# Patient Record
Sex: Female | Born: 1955 | Race: White | Hispanic: No | Marital: Married | Smoking: Never smoker
Health system: Southern US, Community
[De-identification: ages and names within clinical notes are randomized; demographics above are authoritative.]

## PROBLEM LIST (undated history)

## (undated) DIAGNOSIS — I341 Nonrheumatic mitral (valve) prolapse: Secondary | ICD-10-CM

## (undated) HISTORY — DX: Nonrheumatic mitral (valve) prolapse: I34.1

## (undated) HISTORY — PX: FOOT SURGERY: SHX648

## (undated) HISTORY — PX: TUBAL LIGATION: SHX77

---

## 1998-07-12 ENCOUNTER — Other Ambulatory Visit: Admission: RE | Admit: 1998-07-12 | Discharge: 1998-07-12 | Payer: Self-pay | Admitting: Gynecology

## 1999-08-01 ENCOUNTER — Other Ambulatory Visit: Admission: RE | Admit: 1999-08-01 | Discharge: 1999-08-01 | Payer: Self-pay | Admitting: Gynecology

## 1999-08-30 ENCOUNTER — Encounter (INDEPENDENT_AMBULATORY_CARE_PROVIDER_SITE_OTHER): Payer: Self-pay | Admitting: *Deleted

## 1999-08-30 ENCOUNTER — Ambulatory Visit (HOSPITAL_COMMUNITY): Admission: RE | Admit: 1999-08-30 | Discharge: 1999-08-30 | Payer: Self-pay | Admitting: Gynecology

## 1999-08-30 HISTORY — PX: PELVIC LAPAROSCOPY: SHX162

## 2000-09-09 ENCOUNTER — Other Ambulatory Visit: Admission: RE | Admit: 2000-09-09 | Discharge: 2000-09-09 | Payer: Self-pay | Admitting: Gynecology

## 2000-09-16 ENCOUNTER — Encounter: Payer: Self-pay | Admitting: Gynecology

## 2000-09-16 ENCOUNTER — Encounter: Admission: RE | Admit: 2000-09-16 | Discharge: 2000-09-16 | Payer: Self-pay | Admitting: Gynecology

## 2002-04-14 ENCOUNTER — Encounter: Payer: Self-pay | Admitting: Gynecology

## 2002-04-14 ENCOUNTER — Encounter: Admission: RE | Admit: 2002-04-14 | Discharge: 2002-04-14 | Payer: Self-pay | Admitting: Gynecology

## 2002-04-27 ENCOUNTER — Other Ambulatory Visit: Admission: RE | Admit: 2002-04-27 | Discharge: 2002-04-27 | Payer: Self-pay | Admitting: Gynecology

## 2003-04-22 ENCOUNTER — Encounter: Payer: Self-pay | Admitting: Gynecology

## 2003-04-22 ENCOUNTER — Encounter: Admission: RE | Admit: 2003-04-22 | Discharge: 2003-04-22 | Payer: Self-pay | Admitting: Gynecology

## 2003-08-02 ENCOUNTER — Other Ambulatory Visit: Admission: RE | Admit: 2003-08-02 | Discharge: 2003-08-02 | Payer: Self-pay | Admitting: Gynecology

## 2004-10-13 ENCOUNTER — Encounter: Admission: RE | Admit: 2004-10-13 | Discharge: 2004-10-13 | Payer: Self-pay | Admitting: Gynecology

## 2004-12-06 ENCOUNTER — Other Ambulatory Visit: Admission: RE | Admit: 2004-12-06 | Discharge: 2004-12-06 | Payer: Self-pay | Admitting: Gynecology

## 2005-04-04 ENCOUNTER — Ambulatory Visit: Payer: Self-pay | Admitting: Family Medicine

## 2005-12-25 ENCOUNTER — Encounter: Admission: RE | Admit: 2005-12-25 | Discharge: 2005-12-25 | Payer: Self-pay | Admitting: Gynecology

## 2006-05-03 ENCOUNTER — Other Ambulatory Visit: Admission: RE | Admit: 2006-05-03 | Discharge: 2006-05-03 | Payer: Self-pay | Admitting: Gynecology

## 2007-05-13 ENCOUNTER — Other Ambulatory Visit: Admission: RE | Admit: 2007-05-13 | Discharge: 2007-05-13 | Payer: Self-pay | Admitting: Gynecology

## 2008-06-17 ENCOUNTER — Encounter: Admission: RE | Admit: 2008-06-17 | Discharge: 2008-06-17 | Payer: Self-pay | Admitting: Gynecology

## 2008-06-22 ENCOUNTER — Other Ambulatory Visit: Admission: RE | Admit: 2008-06-22 | Discharge: 2008-06-22 | Payer: Self-pay | Admitting: Gynecology

## 2008-06-25 ENCOUNTER — Encounter: Admission: RE | Admit: 2008-06-25 | Discharge: 2008-06-25 | Payer: Self-pay | Admitting: Gynecology

## 2009-10-11 ENCOUNTER — Ambulatory Visit: Payer: Self-pay | Admitting: Gynecology

## 2009-10-11 ENCOUNTER — Other Ambulatory Visit: Admission: RE | Admit: 2009-10-11 | Discharge: 2009-10-11 | Payer: Self-pay | Admitting: Gynecology

## 2009-10-25 ENCOUNTER — Ambulatory Visit: Payer: Self-pay | Admitting: Gynecology

## 2009-10-26 ENCOUNTER — Ambulatory Visit: Payer: Self-pay | Admitting: Podiatry

## 2010-02-20 ENCOUNTER — Ambulatory Visit: Payer: Self-pay | Admitting: Gynecology

## 2010-05-25 ENCOUNTER — Encounter: Admission: RE | Admit: 2010-05-25 | Discharge: 2010-05-25 | Payer: Self-pay | Admitting: Gynecology

## 2010-10-05 ENCOUNTER — Ambulatory Visit: Payer: Self-pay | Admitting: Gynecology

## 2010-10-05 ENCOUNTER — Other Ambulatory Visit: Admission: RE | Admit: 2010-10-05 | Discharge: 2010-10-05 | Payer: Self-pay | Admitting: Gynecology

## 2010-11-08 ENCOUNTER — Ambulatory Visit: Payer: Self-pay | Admitting: Gynecology

## 2011-06-29 ENCOUNTER — Other Ambulatory Visit: Payer: Self-pay | Admitting: Gynecology

## 2011-06-29 DIAGNOSIS — Z1231 Encounter for screening mammogram for malignant neoplasm of breast: Secondary | ICD-10-CM

## 2011-07-12 ENCOUNTER — Ambulatory Visit
Admission: RE | Admit: 2011-07-12 | Discharge: 2011-07-12 | Disposition: A | Discharge status: Home / Self Care | Payer: Self-pay | Source: Ambulatory Visit | Attending: Gynecology | Admitting: Gynecology

## 2011-07-12 DIAGNOSIS — Z1231 Encounter for screening mammogram for malignant neoplasm of breast: Secondary | ICD-10-CM

## 2011-10-03 DIAGNOSIS — I341 Nonrheumatic mitral (valve) prolapse: Secondary | ICD-10-CM | POA: Insufficient documentation

## 2011-10-08 ENCOUNTER — Encounter: Payer: Self-pay | Admitting: Gynecology

## 2011-10-08 ENCOUNTER — Ambulatory Visit (INDEPENDENT_AMBULATORY_CARE_PROVIDER_SITE_OTHER): Payer: 59 | Admitting: Gynecology

## 2011-10-08 VITALS — BP 112/64 | Ht 67.0 in | Wt 126.0 lb

## 2011-10-08 DIAGNOSIS — Z131 Encounter for screening for diabetes mellitus: Secondary | ICD-10-CM

## 2011-10-08 DIAGNOSIS — N949 Unspecified condition associated with female genital organs and menstrual cycle: Secondary | ICD-10-CM

## 2011-10-08 DIAGNOSIS — Z780 Asymptomatic menopausal state: Secondary | ICD-10-CM

## 2011-10-08 DIAGNOSIS — N938 Other specified abnormal uterine and vaginal bleeding: Secondary | ICD-10-CM

## 2011-10-08 DIAGNOSIS — Z13220 Encounter for screening for lipoid disorders: Secondary | ICD-10-CM

## 2011-10-08 DIAGNOSIS — Z01419 Encounter for gynecological examination (general) (routine) without abnormal findings: Secondary | ICD-10-CM

## 2011-10-08 DIAGNOSIS — Z79890 Hormone replacement therapy (postmenopausal): Secondary | ICD-10-CM

## 2011-10-08 MED ORDER — ESTRADIOL 0.1 MG/24HR TD PTTW: 1.0000 | MEDICATED_PATCH | TRANSDERMAL | Status: DC

## 2011-10-08 MED ORDER — PROGESTERONE MICRONIZED 100 MG PO CAPS: 100.0000 mg | ORAL_CAPSULE | Freq: Every day | ORAL | Status: DC

## 2011-10-08 NOTE — Progress Notes (Signed)
Cynthia Edwards [birth year] [id number]        55 y.o.  for annual exam.  Continues on HRT doing well.  Past medical history,surgical history, medications, allergies, family history and social history were all reviewed and documented in the EPIC chart. ROS:  Was performed and pertinent positives and negatives are included in the history.  Exam: chaperone present Filed Vitals:   10/08/11 1431  BP: 112/64   General appearance  Normal Skin grossly normal Head/Neck normal with no cervical or supraclavicular adenopathy thyroid normal Lungs  clear Cardiac RR, without RMG Abdominal  soft, nontender, without masses, organomegaly or hernia Breasts  examined lying and sitting without masses, retractions, discharge or axillary adenopathy. Pelvic  Ext/BUS/vagina  normal with mild atrophic changes  Cervix  normal    Uterus  anteverted, normal size, shape and contour, midline and mobile nontender   Adnexa  Without masses or tenderness    Anus and perineum  normal   Rectovaginal  normal sphincter tone without palpated masses or tenderness.    Assessment/Plan:  55 y.o. female for annual exam.    1. HRT. Patient has had some sporadic spotting this past year. Given she is on HRT I recommended we follow up for endometrial assessment and we'll start with sonohysterogram. Patient will schedule this. We again discussed the issues of HRT risk benefits WHI study increased risk of stroke heart attack DVT as well as increased breast cancer risk. ACOG and NAMS statements for the lowest dose for shortness period time were reviewed. Transdermal versus oral first pass effect benefits also discussed. I refilled both her Vivelle 0.1 mg and Prometrium 100 mg nightly times a year. She will decided if she once to continue or wean herself off this coming year. 2. Breast health. SBE monthly reviewed. She had mammography in August 2012 continue with annual mammography. 3. Bone health. Patient's last bone density was 5 years  ago historically we'll go ahead and repeat a DEXA now she will go ahead and schedule this. Increase calcium vitamin D reviewed. Baseline vitamin D level was ordered today. 4. Pap smear. I reviewed current Pap smear guidelines. I did not do a Pap smear today as she does not have a history of abnormal Pap smears in the past and has numerous normal reports in her chart. Her last Pap smear was a year ago. We'll plan on an every 3 year interval. 5. Colonoscopy. Patient has not had colonoscopy I strongly recommended her to schedule this and she agrees to do so. 6. Health maintenance. CBC urinalysis glucose lipid profile was ordered along with her vitamin D. She will follow up for her sonohysterogram index as noted above and otherwise on an annual basis.    [name] MD, 3:09 PM 10/08/2011

## 2011-10-26 ENCOUNTER — Ambulatory Visit (INDEPENDENT_AMBULATORY_CARE_PROVIDER_SITE_OTHER): Payer: 59

## 2011-10-26 ENCOUNTER — Encounter: Payer: Self-pay | Admitting: Gynecology

## 2011-10-26 ENCOUNTER — Ambulatory Visit (INDEPENDENT_AMBULATORY_CARE_PROVIDER_SITE_OTHER): Payer: 59 | Admitting: Gynecology

## 2011-10-26 DIAGNOSIS — N938 Other specified abnormal uterine and vaginal bleeding: Secondary | ICD-10-CM

## 2011-10-26 DIAGNOSIS — Z79890 Hormone replacement therapy (postmenopausal): Secondary | ICD-10-CM

## 2011-10-26 DIAGNOSIS — D259 Leiomyoma of uterus, unspecified: Secondary | ICD-10-CM

## 2011-10-26 DIAGNOSIS — N950 Postmenopausal bleeding: Secondary | ICD-10-CM

## 2011-10-26 DIAGNOSIS — N949 Unspecified condition associated with female genital organs and menstrual cycle: Secondary | ICD-10-CM

## 2011-10-26 NOTE — Progress Notes (Signed)
Patient presents for sonohysterogram due to postmenopausal spotting on HRT. Ultrasound initially shows endometrial echo of 3.7 mm. She has multiple small myomas largest measuring 19 mm. Right / left ovaries are visualized and normal. Subsequently sonohysterogram was performed, sterile technique, easy catheter introduction, good distention with no abnormalities. An endometrial sample was taken. Patient tolerated well although she had a fair amount of discomfort with the procedure.  Assessment and plan: Postmenopausal spotting, atrophic pattern on ultrasound with scant return of biopsy. I discussed with her that if biopsy returns inadequate that's acceptable and that we'll continue to monitor. If persistent bleeding then will make adjustments to her HRT. Otherwise she will see me in a year sooner as needed.

## 2011-10-26 NOTE — Patient Instructions (Signed)
Report any continued bleeding otherwise follow up annually

## 2011-12-26 ENCOUNTER — Ambulatory Visit (INDEPENDENT_AMBULATORY_CARE_PROVIDER_SITE_OTHER): Payer: BC Managed Care – PPO

## 2011-12-26 DIAGNOSIS — Z780 Asymptomatic menopausal state: Secondary | ICD-10-CM

## 2011-12-26 DIAGNOSIS — Z13820 Encounter for screening for osteoporosis: Secondary | ICD-10-CM

## 2012-10-01 ENCOUNTER — Telehealth: Payer: Self-pay | Admitting: *Deleted

## 2012-10-01 MED ORDER — ESTRADIOL 0.1 MG/24HR TD PTTW: 1.0000 | MEDICATED_PATCH | TRANSDERMAL | Status: DC

## 2012-10-01 NOTE — Telephone Encounter (Signed)
Pt has annual scheduled on 10/10/12. Pt will need rx for patches, they expired this week. rx will be sent 0 refills.

## 2012-10-10 ENCOUNTER — Ambulatory Visit (INDEPENDENT_AMBULATORY_CARE_PROVIDER_SITE_OTHER): Payer: BC Managed Care – PPO | Admitting: Gynecology

## 2012-10-10 ENCOUNTER — Encounter: Payer: Self-pay | Admitting: Gynecology

## 2012-10-10 VITALS — BP 104/64 | Ht 66.25 in | Wt 134.0 lb

## 2012-10-10 DIAGNOSIS — Z01419 Encounter for gynecological examination (general) (routine) without abnormal findings: Secondary | ICD-10-CM

## 2012-10-10 DIAGNOSIS — Z79890 Hormone replacement therapy (postmenopausal): Secondary | ICD-10-CM

## 2012-10-10 DIAGNOSIS — Z131 Encounter for screening for diabetes mellitus: Secondary | ICD-10-CM

## 2012-10-10 DIAGNOSIS — Z13220 Encounter for screening for lipoid disorders: Secondary | ICD-10-CM

## 2012-10-10 MED ORDER — ESTRADIOL 0.05 MG/24HR TD PTTW: 1.0000 | MEDICATED_PATCH | TRANSDERMAL | Status: DC

## 2012-10-10 MED ORDER — PROGESTERONE MICRONIZED 100 MG PO CAPS: 100.0000 mg | ORAL_CAPSULE | Freq: Every day | ORAL | Status: DC

## 2012-10-10 NOTE — Patient Instructions (Signed)
Call to Schedule your mammogram  Facilities in [place]: 1)  [facility], [address] Phone: [phone] 2)  [facility], [address] Phone: [phone] 3)  Dr. Bertrand at [facility]  [address] Phone: [phone]     Mammogram A mammogram is an X-ray test to find changes in a woman's breast. You should get a mammogram if:  You are 40 years of age or older  You have risk factors.   Your doctor recommends that you have one.  BEFORE THE TEST  Do not schedule the test the week before your period, especially if your breasts are sore during this time.  On the day of your mammogram:  Wash your breasts and armpits well. After washing, do not put on any deodorant or talcum powder on until after your test.   Eat and drink as you usually do.   Take your medicines as usual.   If you are diabetic and take insulin, make sure you:   Eat before coming for your test.   Take your insulin as usual.   If you cannot keep your appointment, call before the appointment to cancel. Schedule another appointment.  TEST  You will need to undress from the waist up. You will put on a hospital gown.   Your breast will be put on the mammogram machine, and it will press firmly on your breast with a piece of plastic called a compression paddle. This will make your breast flatter so that the machine can X-ray all parts of your breast.   Both breasts will be X-rayed. Each breast will be X-rayed from above and from the side. An X-ray might need to be taken again if the picture is not good enough.   The mammogram will last about 15 to 30 minutes.  AFTER THE TEST Finding out the results of your test Ask when your test results will be ready. Make sure you get your test results.  Document Released: 02/01/2009 Document Revised: 10/25/2011 Document Reviewed: 02/01/2009 [employer] Patient  Information 2012 [employer]   

## 2012-10-10 NOTE — Progress Notes (Signed)
MOSELLA FALTER [birth year] [id number]        56 y.o.  G0P0 for annual exam.    Past medical history,surgical history, medications, allergies, family history and social history were all reviewed and documented in the EPIC chart. ROS:  Was performed and pertinent positives and negatives are included in the history.  Exam: Sherrilyn Rist assistant Filed Vitals:   10/10/12 1406  BP: 104/64  Height: 5' 6.25" (1.683 m)  Weight: 134 lb (60.782 kg)   General appearance  Normal Skin grossly normal Head/Neck normal with no cervical or supraclavicular adenopathy thyroid normal Lungs  clear Cardiac RR, without RMG Abdominal  soft, nontender, without masses, organomegaly or hernia Breasts  examined lying and sitting without masses, retractions, discharge or axillary adenopathy. Pelvic  Ext/BUS/vagina  normal was no atrophic changes  Cervix  normal   Uterus  anteverted, normal size, shape and contour, midline and mobile nontender   Adnexa  Without masses or tenderness    Anus and perineum  normal   Rectovaginal  normal sphincter tone without palpated masses or tenderness.    Assessment/Plan:  56 y.o. G0P0 female for annual exam.   1. HRT. Patient is on Vivelle 0.1 mg and Prometrium 100 mg at night. She continues to have sporadic menses. Had one in May and then one in November. Patient reports they're just like regular menses in flow and character.  Was evaluated with normal sonohysterogram and biopsy showing secretory endometrium 10/2011.  I again reviewed the risks benefits of HRT to include the WHI study and risks of stroke heart attack DVT and breast cancer. Benefits of transdermal first pass effect also discussed. Options to wean or stop altogether versus continuing discussed. Patient was to continue I did suggest trying a lower dose and I gave her samples of 0.05 MiniVivelle and refill this times a year and Prometrium 100 mg nightly times a year. Will keep menstrual calendar at this point as long as  less frequent but regular will watch if prolonged or atypical should represent for evaluation. 2. Mammography. Patient overdo it she knows to schedule this. SBE monthly reviewed. 3. Pap smear. No Pap smear done today. Last Pap smear 2011. No history of abnormal Pap smears. Plan repeat next year a 3 year interval 4. Colonoscopy. Patients never happen I stressed the need to schedule him the importance of early detection. Patient agrees to do so. 5. DEXA. DEXA 2013 normal. Plan repeat at five-year interval.  Increased calcium vitamin D reviewed.  Check vitamin D level today. 6. Health maintenance.  Baseline CBC lipid profile offer his metabolic panel urinalysis ordered along with vitamin D. Follow up one year, sooner as needed    [name] MD, 2:30 PM 10/10/2012

## 2012-10-11 LAB — CBC WITH DIFFERENTIAL/PLATELET
Basophils Absolute: 0 10*3/uL (ref 0.0–0.1)
Eosinophils Absolute: 0.1 10*3/uL (ref 0.0–0.7)
Eosinophils Relative: 1 % (ref 0–5)
Lymphocytes Relative: 25 % (ref 12–46)
MCH: 29.2 pg (ref 26.0–34.0)
MCV: 87.4 fL (ref 78.0–100.0)
Platelets: 229 10*3/uL (ref 150–400)
RDW: 13.5 % (ref 11.5–15.5)
WBC: 5.7 10*3/uL (ref 4.0–10.5)

## 2012-10-11 LAB — COMPREHENSIVE METABOLIC PANEL
ALT: 15 U/L (ref 0–35)
AST: 22 U/L (ref 0–37)
Alkaline Phosphatase: 50 U/L (ref 39–117)
Creat: 0.77 mg/dL (ref 0.50–1.10)
Total Bilirubin: 0.4 mg/dL (ref 0.3–1.2)

## 2012-10-11 LAB — URINALYSIS W MICROSCOPIC + REFLEX CULTURE
Leukocytes, UA: NEGATIVE
Protein, ur: NEGATIVE mg/dL
Squamous Epithelial / LPF: NONE SEEN
Urobilinogen, UA: 0.2 mg/dL (ref 0.0–1.0)

## 2012-10-11 LAB — LIPID PANEL
Total CHOL/HDL Ratio: 3 Ratio
VLDL: 16 mg/dL (ref 0–40)

## 2012-10-11 LAB — VITAMIN D 25 HYDROXY (VIT D DEFICIENCY, FRACTURES): Vit D, 25-Hydroxy: 31 ng/mL (ref 30–89)

## 2013-04-02 ENCOUNTER — Other Ambulatory Visit: Payer: Self-pay

## 2013-04-02 DIAGNOSIS — Z1231 Encounter for screening mammogram for malignant neoplasm of breast: Secondary | ICD-10-CM

## 2013-04-22 ENCOUNTER — Ambulatory Visit
Admission: RE | Admit: 2013-04-22 | Discharge: 2013-04-22 | Disposition: A | Discharge status: Home / Self Care | Payer: BC Managed Care – PPO | Source: Ambulatory Visit

## 2013-04-22 DIAGNOSIS — Z1231 Encounter for screening mammogram for malignant neoplasm of breast: Secondary | ICD-10-CM

## 2013-04-23 ENCOUNTER — Other Ambulatory Visit: Payer: Self-pay | Admitting: Gynecology

## 2013-04-24 ENCOUNTER — Other Ambulatory Visit: Payer: Self-pay | Admitting: Gynecology

## 2013-04-27 ENCOUNTER — Other Ambulatory Visit: Payer: Self-pay | Admitting: Gynecology

## 2013-04-27 DIAGNOSIS — R928 Other abnormal and inconclusive findings on diagnostic imaging of breast: Secondary | ICD-10-CM

## 2013-05-08 ENCOUNTER — Ambulatory Visit
Admission: RE | Admit: 2013-05-08 | Discharge: 2013-05-08 | Disposition: A | Discharge status: Home / Self Care | Payer: BC Managed Care – PPO | Source: Ambulatory Visit | Attending: Gynecology | Admitting: Gynecology

## 2013-05-08 DIAGNOSIS — R928 Other abnormal and inconclusive findings on diagnostic imaging of breast: Secondary | ICD-10-CM

## 2013-10-19 ENCOUNTER — Ambulatory Visit (INDEPENDENT_AMBULATORY_CARE_PROVIDER_SITE_OTHER): Payer: Managed Care, Other (non HMO) | Admitting: Gynecology

## 2013-10-19 ENCOUNTER — Other Ambulatory Visit (HOSPITAL_COMMUNITY)
Admission: RE | Admit: 2013-10-19 | Discharge: 2013-10-19 | Disposition: A | Discharge status: Home / Self Care | Payer: Managed Care, Other (non HMO) | Source: Ambulatory Visit | Attending: Gynecology | Admitting: Gynecology

## 2013-10-19 ENCOUNTER — Encounter: Payer: Self-pay | Admitting: Gynecology

## 2013-10-19 VITALS — BP 116/70 | Ht 66.0 in | Wt 126.0 lb

## 2013-10-19 DIAGNOSIS — Z79890 Hormone replacement therapy (postmenopausal): Secondary | ICD-10-CM

## 2013-10-19 DIAGNOSIS — Z01419 Encounter for gynecological examination (general) (routine) without abnormal findings: Secondary | ICD-10-CM | POA: Insufficient documentation

## 2013-10-19 LAB — COMPREHENSIVE METABOLIC PANEL
ALT: 11 U/L (ref 0–35)
AST: 16 U/L (ref 0–37)
Albumin: 4.4 g/dL (ref 3.5–5.2)
CO2: 31 mEq/L (ref 19–32)
Calcium: 9.9 mg/dL (ref 8.4–10.5)
Chloride: 101 mEq/L (ref 96–112)
Creat: 0.76 mg/dL (ref 0.50–1.10)
Potassium: 3.8 mEq/L (ref 3.5–5.3)
Sodium: 140 mEq/L (ref 135–145)
Total Protein: 6.6 g/dL (ref 6.0–8.3)

## 2013-10-19 LAB — CBC WITH DIFFERENTIAL/PLATELET
Eosinophils Relative: 1 % (ref 0–5)
Lymphocytes Relative: 23 % (ref 12–46)
Lymphs Abs: 1.3 10*3/uL (ref 0.7–4.0)
MCV: 88.6 fL (ref 78.0–100.0)
Neutro Abs: 3.9 10*3/uL (ref 1.7–7.7)
Platelets: 235 10*3/uL (ref 150–400)
RBC: 4.65 MIL/uL (ref 3.87–5.11)
WBC: 5.5 10*3/uL (ref 4.0–10.5)

## 2013-10-19 LAB — LIPID PANEL: Cholesterol: 239 mg/dL — ABNORMAL HIGH (ref 0–200)

## 2013-10-19 MED ORDER — PROGESTERONE MICRONIZED 100 MG PO CAPS: 100.0000 mg | ORAL_CAPSULE | Freq: Every day | ORAL | Status: DC

## 2013-10-19 MED ORDER — ESTRADIOL 0.05 MG/24HR TD PTTW
1.0000 | MEDICATED_PATCH | TRANSDERMAL | Status: DC
Start: 2013-10-19 — End: 2014-10-21

## 2013-10-19 NOTE — Progress Notes (Signed)
Cynthia Edwards 1956/04/19 [id number]        57 y.o.  G0P0 for annual exam.  Several issues noted below.  Past medical history,surgical history, problem list, medications, allergies, family history and social history were all reviewed and documented in the EPIC chart.  ROS:  Performed and pertinent positives and negatives are included in the history, assessment and plan .  Exam: [name] Filed Vitals:   10/19/13 1359  BP: 116/70  Height: 5\' 6"  (1.676 m)  Weight: 126 lb (57.153 kg)   General appearance  Normal Skin grossly normal Head/Neck normal with no cervical or supraclavicular adenopathy thyroid normal Lungs  clear Cardiac RR, without RMG Abdominal  soft, nontender, without masses, organomegaly or hernia Breasts  examined lying and sitting without masses, retractions, discharge or axillary adenopathy. Pelvic  Ext/BUS/vagina  Normal with mild atrophic changes  Cervix  Normal with stenotic external os. Pap done with cervical mucus noted.  Uterus  anteverted, normal size, shape and contour, midline and mobile nontender   Adnexa  Without masses or tenderness    Anus and perineum  Normal   Rectovaginal  Normal sphincter tone without palpated masses or tenderness.    Assessment/Plan:  57 y.o. G0P0 female for annual exam.   1. HRT. Patient's on Minivelle 0.05 mg patches and Prometrium 100 mg nightly. She's doing well with this. She had questions about trying to wean now.  I again reviewed the whole issue of HRT with her to include the WHI study with increased risk of stroke, heart attack, DVT and breast cancer. The ACOG and NAMS statements for lowest dose for the shortest period of time reviewed. Transdermal versus oral first-pass effect benefit discussed. I refilled her HRT at her request and she is going to try to wean this coming winter. She'll call me if she has any issues with this. She's done no vaginal bleeding she does report any vaginal bleeding. 2. Pap smear 2011. Pap  smear done today. No history of abnormal Pap smears previously. Plan 3 year repeat if this is normal. 3. Mammography 04/2013. Continue with annual mammography. SBE monthly reviewed. 4. DEXA 2013 normal. Plan repeat at 5 year interval. Increase calcium vitamin D reviewed. Check vitamin D level today. 5. Colonoscopy never. Benefits of precancer polyp removal and early detection reviewed. Patient agrees to schedule. 6. Health maintenance. Baseline CBC comprehensive metabolic panel lipid profile TSH vitamin D urinalysis ordered. Followup one year, sooner as needed.   Note: This document was prepared with digital dictation and possible smart phrase technology. Any transcriptional errors that result from this process are unintentional.   [name] MD, 2:35 PM 10/19/2013

## 2013-10-19 NOTE — Patient Instructions (Signed)
Weaning from hormone replacement as we discussed if you choose to do so. Call me if you have any questions. Schedule colonoscopy with [facility] at [phone] or [facility] at [phone] Followup in one year for annual exam.

## 2013-10-19 NOTE — Addendum Note (Signed)
Addended by: Dayna Barker on: 10/19/2013 03:20 PM   Modules accepted: Orders

## 2013-10-20 ENCOUNTER — Other Ambulatory Visit: Payer: Self-pay | Admitting: Gynecology

## 2013-10-20 DIAGNOSIS — E7800 Pure hypercholesterolemia, unspecified: Secondary | ICD-10-CM

## 2013-10-20 LAB — URINALYSIS W MICROSCOPIC + REFLEX CULTURE
Bilirubin Urine: NEGATIVE
Crystals: NONE SEEN
Glucose, UA: NEGATIVE mg/dL
Leukocytes, UA: NEGATIVE
Specific Gravity, Urine: 1.022 (ref 1.005–1.030)
Squamous Epithelial / LPF: NONE SEEN
pH: 7 (ref 5.0–8.0)

## 2013-10-20 LAB — VITAMIN D 25 HYDROXY (VIT D DEFICIENCY, FRACTURES): Vit D, 25-Hydroxy: 39 ng/mL (ref 30–89)

## 2014-05-24 ENCOUNTER — Ambulatory Visit: Payer: Self-pay | Admitting: Physician Assistant

## 2014-06-14 ENCOUNTER — Emergency Department: Payer: Self-pay | Admitting: Emergency Medicine

## 2014-08-18 ENCOUNTER — Other Ambulatory Visit: Payer: Self-pay

## 2014-08-18 DIAGNOSIS — Z1231 Encounter for screening mammogram for malignant neoplasm of breast: Secondary | ICD-10-CM

## 2014-08-31 ENCOUNTER — Encounter: Payer: Self-pay | Admitting: Gynecology

## 2014-10-21 ENCOUNTER — Ambulatory Visit (INDEPENDENT_AMBULATORY_CARE_PROVIDER_SITE_OTHER): Payer: No Typology Code available for payment source | Admitting: Gynecology

## 2014-10-21 ENCOUNTER — Encounter: Payer: Managed Care, Other (non HMO) | Admitting: Gynecology

## 2014-10-21 ENCOUNTER — Encounter: Payer: Self-pay | Admitting: Gynecology

## 2014-10-21 VITALS — BP 120/70 | Ht 66.0 in | Wt 128.0 lb

## 2014-10-21 DIAGNOSIS — Z01419 Encounter for gynecological examination (general) (routine) without abnormal findings: Secondary | ICD-10-CM

## 2014-10-21 DIAGNOSIS — Z79890 Hormone replacement therapy (postmenopausal): Secondary | ICD-10-CM

## 2014-10-21 DIAGNOSIS — N951 Menopausal and female climacteric states: Secondary | ICD-10-CM

## 2014-10-21 MED ORDER — ESTRADIOL 0.05 MG/24HR TD PTTW: 1.0000 | MEDICATED_PATCH | TRANSDERMAL | Status: DC

## 2014-10-21 MED ORDER — PROGESTERONE MICRONIZED 100 MG PO CAPS: 100.0000 mg | ORAL_CAPSULE | Freq: Every day | ORAL | Status: AC

## 2014-10-21 NOTE — Progress Notes (Signed)
Cynthia Edwards Feb 04, 1956 [id number]        58 y.o.  G0P0 for annual exam.  Several issues noted below.  Past medical history,surgical history, problem list, medications, allergies, family history and social history were all reviewed and documented as reviewed in the EPIC chart.  ROS:  12 system ROS performed with pertinent positives and negatives included in the history, assessment and plan.   Additional significant findings :  none   Exam: Kim Counsellor Vitals:   10/21/14 1612  BP: 120/70  Height: 5\' 6"  (1.676 m)  Weight: 128 lb (58.06 kg)   General appearance:  Normal affect, orientation and appearance. Skin: Grossly normal HEENT: Without gross lesions.  No cervical or supraclavicular adenopathy. Thyroid normal.  Lungs:  Clear without wheezing, rales or rhonchi Cardiac: RR, without RMG Abdominal:  Soft, nontender, without masses, guarding, rebound, organomegaly or hernia Breasts:  Examined lying and sitting without masses, retractions, discharge or axillary adenopathy. Pelvic:  Ext/BUS/vagina normal with mild atrophic changes  Cervix normal with mild atrophic changes  Uterus anteverted, normal size, shape and contour, midline and mobile nontender   Adnexa  Without masses or tenderness    Anus and perineum  Normal   Rectovaginal  Normal sphincter tone without palpated masses or tenderness.    Assessment/Plan:  58 y.o. G0P0 female for annual exam.   1. Postmenopausal/HRT. Patient continues on minivelle 0.05 mg patch and Prometrium 100 mg at bedtime.  Is doing well. Tried weaning this past year and had unacceptable hot flushes sweats and mood swings.  I again reviewed the whole issue of HRT with her to include the WHI study with increased risk of stroke, heart attack, DVT and breast cancer. The ACOG and NAMS statements for lowest dose for the shortest period of time reviewed. Transdermal versus oral first-pass effect benefit discussed.  Patient wants to continue and I  refilled her 1 year. Has done no vaginal bleeding. Patient knows to report any vaginal bleeding. 2. DEXA 2013 normal. Plan repeat at at least 5 year interval. Increased calcium vitamin D reviewed.  Check vitamin D level today. 3. Pap smear 2014. No Pap smear done today. No history of significant abnormal Pap smears. Plan repeat Pap smear at 3 year interval. 4. Mammography 08/2014. Continue with annual mammography. SBE monthly reviewed. 5. Colonoscopy never. I again viewed the benefits of colonoscopy and early detection. Patient agrees to schedule the names and numbers provided. 6. Health maintenance. Baseline CBC comprehensive metabolic panel lipid profile urinalysis TSH vitamin D ordered. Follow up in one year, sooner as needed.     Anastasio Auerbach MD, 4:52 PM 10/21/2014

## 2014-10-21 NOTE — Patient Instructions (Signed)
Schedule colonoscopy with [facility] at [phone] or [facility] gastroenterology at [phone]  You may obtain a copy of any labs that were done today by logging onto MyChart as outlined in the instructions provided with your AVS (after visit summary). The office will not call with normal lab results but certainly if there are any significant abnormalities then we will contact you.   Health Maintenance, Female A healthy lifestyle and preventative care can promote health and wellness.  Maintain regular health, dental, and eye exams.  Eat a healthy diet. Foods like vegetables, fruits, whole grains, low-fat dairy products, and lean protein foods contain the nutrients you need without too many calories. Decrease your intake of foods high in solid fats, added sugars, and salt. Get information about a proper diet from your caregiver, if necessary.  Regular physical exercise is one of the most important things you can do for your health. Most adults should get at least 150 minutes of moderate-intensity exercise (any activity that increases your heart rate and causes you to sweat) each week. In addition, most adults need muscle-strengthening exercises on 2 or more days a week.   Maintain a healthy weight. The body mass index (BMI) is a screening tool to identify possible weight problems. It provides an estimate of body fat based on height and weight. Your caregiver can help determine your BMI, and can help you achieve or maintain a healthy weight. For adults 20 years and older:  A BMI below 18.5 is considered underweight.  A BMI of 18.5 to 24.9 is normal.  A BMI of 25 to 29.9 is considered overweight.  A BMI of 30 and above is considered obese.  Maintain normal blood lipids and cholesterol by exercising and minimizing your intake of saturated fat. Eat a balanced diet with plenty of fruits and vegetables. Blood tests for lipids and cholesterol should begin at age 64 and be repeated every  5 years. If your lipid or cholesterol levels are high, you are over 50, or you are a high risk for heart disease, you may need your cholesterol levels checked more frequently.Ongoing high lipid and cholesterol levels should be treated with medicines if diet and exercise are not effective.  If you smoke, find out from your caregiver how to quit. If you do not use tobacco, do not start.  Lung cancer screening is recommended for adults aged 58 80 years who are at high risk for developing lung cancer because of a history of smoking. Yearly low-dose computed tomography (CT) is recommended for people who have at least a 30-pack-year history of smoking and are a current smoker or have quit within the past 15 years. A pack year of smoking is smoking an average of 1 pack of cigarettes a day for 1 year (for example: 1 pack a day for 30 years or 2 packs a day for 15 years). Yearly screening should continue until the smoker has stopped smoking for at least 15 years. Yearly screening should also be stopped for people who develop a health problem that would prevent them from having lung cancer treatment.  If you are pregnant, do not drink alcohol. If you are breastfeeding, be very cautious about drinking alcohol. If you are not pregnant and choose to drink alcohol, do not exceed 1 drink per day. One drink is considered to be 12 ounces (355 mL) of beer, 5 ounces (148 mL) of wine, or 1.5 ounces (44 mL) of liquor.  Avoid use of street drugs. Do not share needles  with anyone. Ask for help if you need support or instructions about stopping the use of drugs.  High blood pressure causes heart disease and increases the risk of stroke. Blood pressure should be checked at least every 1 to 2 years. Ongoing high blood pressure should be treated with medicines, if weight loss and exercise are not effective.  If you are 55 to 58 years old, ask your caregiver if you should take aspirin to prevent strokes.  Diabetes screening  involves taking a blood sample to check your fasting blood sugar level. This should be done once every 3 years, after age 45, if you are within normal weight and without risk factors for diabetes. Testing should be considered at a younger age or be carried out more frequently if you are overweight and have at least 1 risk factor for diabetes.  Breast cancer screening is essential preventative care for women. You should practice "breast self-awareness." This means understanding the normal appearance and feel of your breasts and may include breast self-examination. Any changes detected, no matter how small, should be reported to a caregiver. Women in their 20s and 30s should have a clinical breast exam (CBE) by a caregiver as part of a regular health exam every 1 to 3 years. After age 40, women should have a CBE every year. Starting at age 40, women should consider having a mammogram (breast X-ray) every year. Women who have a family history of breast cancer should talk to their caregiver about genetic screening. Women at a high risk of breast cancer should talk to their caregiver about having an MRI and a mammogram every year.  Breast cancer gene (BRCA)-related cancer risk assessment is recommended for women who have family members with BRCA-related cancers. BRCA-related cancers include breast, ovarian, tubal, and peritoneal cancers. Having family members with these cancers may be associated with an increased risk for harmful changes (mutations) in the breast cancer genes BRCA1 and BRCA2. Results of the assessment will determine the need for genetic counseling and BRCA1 and BRCA2 testing.  The Pap test is a screening test for cervical cancer. Women should have a Pap test starting at age 21. Between ages 21 and 29, Pap tests should be repeated every 2 years. Beginning at age 30, you should have a Pap test every 3 years as long as the past 3 Pap tests have been normal. If you had a hysterectomy for a problem that  was not cancer or a condition that could lead to cancer, then you no longer need Pap tests. If you are between ages 65 and 70, and you have had normal Pap tests going back 10 years, you no longer need Pap tests. If you have had past treatment for cervical cancer or a condition that could lead to cancer, you need Pap tests and screening for cancer for at least 20 years after your treatment. If Pap tests have been discontinued, risk factors (such as a new sexual partner) need to be reassessed to determine if screening should be resumed. Some women have medical problems that increase the chance of getting cervical cancer. In these cases, your caregiver may recommend more frequent screening and Pap tests.  The human papillomavirus (HPV) test is an additional test that may be used for cervical cancer screening. The HPV test looks for the virus that can cause the cell changes on the cervix. The cells collected during the Pap test can be tested for HPV. The HPV test could be used to screen women aged   30 years and older, and should be used in women of any age who have unclear Pap test results. After the age of 30, women should have HPV testing at the same frequency as a Pap test.  Colorectal cancer can be detected and often prevented. Most routine colorectal cancer screening begins at the age of 50 and continues through age 75. However, your caregiver may recommend screening at an earlier age if you have risk factors for colon cancer. On a yearly basis, your caregiver may provide home test kits to check for hidden blood in the stool. Use of a small camera at the end of a tube, to directly examine the colon (sigmoidoscopy or colonoscopy), can detect the earliest forms of colorectal cancer. Talk to your caregiver about this at age 50, when routine screening begins. Direct examination of the colon should be repeated every 5 to 10 years through age 75, unless early forms of pre-cancerous polyps or small growths are  found.  Hepatitis C blood testing is recommended for all people born from 1945 through 1965 and any individual with known risks for hepatitis C.  Practice safe sex. Use condoms and avoid high-risk sexual practices to reduce the spread of sexually transmitted infections (STIs). Sexually active women aged 25 and younger should be checked for Chlamydia, which is a common sexually transmitted infection. Older women with new or multiple partners should also be tested for Chlamydia. Testing for other STIs is recommended if you are sexually active and at increased risk.  Osteoporosis is a disease in which the bones lose minerals and strength with aging. This can result in serious bone fractures. The risk of osteoporosis can be identified using a bone density scan. Women ages 65 and over and women at risk for fractures or osteoporosis should discuss screening with their caregivers. Ask your caregiver whether you should be taking a calcium supplement or vitamin D to reduce the rate of osteoporosis.  Menopause can be associated with physical symptoms and risks. Hormone replacement therapy is available to decrease symptoms and risks. You should talk to your caregiver about whether hormone replacement therapy is right for you.  Use sunscreen. Apply sunscreen liberally and repeatedly throughout the day. You should seek shade when your shadow is shorter than you. Protect yourself by wearing long sleeves, pants, a wide-brimmed hat, and sunglasses year round, whenever you are outdoors.  Notify your caregiver of new moles or changes in moles, especially if there is a change in shape or color. Also notify your caregiver if a mole is larger than the size of a pencil eraser.  Stay current with your immunizations. Document Released: 05/21/2011 Document Revised: 03/02/2013 Document Reviewed: 05/21/2011 [employer] Patient Information 2014 [employer].   

## 2014-10-22 LAB — URINALYSIS W MICROSCOPIC + REFLEX CULTURE
BACTERIA UA: NONE SEEN
BILIRUBIN URINE: NEGATIVE
Casts: NONE SEEN
Crystals: NONE SEEN
GLUCOSE, UA: NEGATIVE mg/dL
Hgb urine dipstick: NEGATIVE
Ketones, ur: NEGATIVE mg/dL
Leukocytes, UA: NEGATIVE
Nitrite: NEGATIVE
Protein, ur: NEGATIVE mg/dL
SPECIFIC GRAVITY, URINE: 1.01 (ref 1.005–1.030)
Squamous Epithelial / LPF: NONE SEEN
UROBILINOGEN UA: 0.2 mg/dL (ref 0.0–1.0)
pH: 7 (ref 5.0–8.0)

## 2014-10-22 LAB — CBC WITH DIFFERENTIAL/PLATELET
BASOS ABS: 0.1 10*3/uL (ref 0.0–0.1)
Basophils Relative: 1 % (ref 0–1)
EOS PCT: 1 % (ref 0–5)
Eosinophils Absolute: 0.1 10*3/uL (ref 0.0–0.7)
HCT: 37.8 % (ref 36.0–46.0)
Hemoglobin: 12.5 g/dL (ref 12.0–15.0)
Lymphocytes Relative: 19 % (ref 12–46)
Lymphs Abs: 1.2 10*3/uL (ref 0.7–4.0)
MCH: 29.1 pg (ref 26.0–34.0)
MCHC: 33.1 g/dL (ref 30.0–36.0)
MCV: 87.9 fL (ref 78.0–100.0)
MONOS PCT: 7 % (ref 3–12)
MPV: 11.3 fL (ref 9.4–12.4)
Monocytes Absolute: 0.4 10*3/uL (ref 0.1–1.0)
Neutro Abs: 4.6 10*3/uL (ref 1.7–7.7)
Neutrophils Relative %: 72 % (ref 43–77)
PLATELETS: 201 10*3/uL (ref 150–400)
RBC: 4.3 MIL/uL (ref 3.87–5.11)
RDW: 13.5 % (ref 11.5–15.5)
WBC: 6.4 10*3/uL (ref 4.0–10.5)

## 2014-10-22 LAB — TSH: TSH: 2.597 u[IU]/mL (ref 0.350–4.500)

## 2014-10-22 LAB — COMPREHENSIVE METABOLIC PANEL
ALBUMIN: 4.4 g/dL (ref 3.5–5.2)
ALT: 20 U/L (ref 0–35)
AST: 19 U/L (ref 0–37)
Alkaline Phosphatase: 51 U/L (ref 39–117)
BILIRUBIN TOTAL: 0.3 mg/dL (ref 0.2–1.2)
BUN: 21 mg/dL (ref 6–23)
CO2: 28 mEq/L (ref 19–32)
Calcium: 9.8 mg/dL (ref 8.4–10.5)
Chloride: 102 mEq/L (ref 96–112)
Creat: 0.78 mg/dL (ref 0.50–1.10)
Glucose, Bld: 86 mg/dL (ref 70–99)
POTASSIUM: 4 meq/L (ref 3.5–5.3)
SODIUM: 140 meq/L (ref 135–145)
Total Protein: 6.5 g/dL (ref 6.0–8.3)

## 2014-10-22 LAB — LIPID PANEL
Cholesterol: 216 mg/dL — ABNORMAL HIGH (ref 0–200)
HDL: 67 mg/dL (ref >39)
LDL Cholesterol: 136 mg/dL — ABNORMAL HIGH (ref 0–99)
Total CHOL/HDL Ratio: 3.2 Ratio
Triglycerides: 67 mg/dL (ref <150)
VLDL: 13 mg/dL (ref 0–40)

## 2014-10-22 LAB — VITAMIN D 25 HYDROXY (VIT D DEFICIENCY, FRACTURES): Vit D, 25-Hydroxy: 33 ng/mL (ref 30–100)

## 2014-12-20 ENCOUNTER — Telehealth: Payer: Self-pay | Admitting: *Deleted

## 2014-12-20 NOTE — Telephone Encounter (Signed)
(  pt aware you are out of the office) Pt called and said the minivelle patch has increased once again on price. She asked if you could switch to pill form of estradiol. Please advise

## 2014-12-21 MED ORDER — ESTRADIOL 0.5 MG PO TABS: 0.5000 mg | ORAL_TABLET | Freq: Every day | ORAL | Status: DC

## 2014-12-21 NOTE — Telephone Encounter (Signed)
Left on pt voicemail this has been done, Rx sent

## 2014-12-21 NOTE — Telephone Encounter (Signed)
Okay for estradiol 0.5 mg daily along with her Prometrium 100 mg daily. Refill through her next annual exam
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
## 2015-02-24 ENCOUNTER — Encounter: Payer: Self-pay | Admitting: Gynecology

## 2015-02-24 ENCOUNTER — Ambulatory Visit (INDEPENDENT_AMBULATORY_CARE_PROVIDER_SITE_OTHER): Payer: No Typology Code available for payment source | Admitting: Gynecology

## 2015-02-24 VITALS — BP 120/76

## 2015-02-24 DIAGNOSIS — N644 Mastodynia: Secondary | ICD-10-CM

## 2015-02-24 DIAGNOSIS — Z79890 Hormone replacement therapy (postmenopausal): Secondary | ICD-10-CM

## 2015-02-24 NOTE — Progress Notes (Signed)
Cynthia Edwards 07/12/1956 [id number]        59 y.o.  G0P0 Presents having switched from estrogen patch to oral estrogen and noticed that she started developing hot flashes again and persistent left breast pain. She has felt no masses but just a tenderness that seems to come and go. She subsequently switched back to the patch and now notices that the left breast tenderness is getting better.  Last mammogram normal 08/2014.  Past medical history,surgical history, problem list, medications, allergies, family history and social history were all reviewed and documented in the EPIC chart.  Directed ROS with pertinent positives and negatives documented in the history of present illness/assessment and plan.  Exam: [name] Filed Vitals:   02/24/15 1123  BP: 120/76   General appearance:  Normal Both breasts examined lying and sitting without masses, retractions, discharge, adenopathy. The area she's pointing to is in the left tail of Spence.  Assessment/Plan:  59 y.o. G0P0 with left breast tenderness with switching of her HRT. Now resolving. Reviewed options to include observation now as long as it totally resolves then will follow versus proceeding with diagnostic mammography ultrasound. Patient's comfortable with watching for now. If her pain would persist in 1-2 weeks since she is now back on the patch she will call and we will proceed with a diagnostic mammogram and ultrasound. Assuming her pain totally resolves then she'll follow expectantly and report any palpable masses or other issues.    Anastasio Auerbach MD, 11:44 AM 02/24/2015

## 2015-02-24 NOTE — Patient Instructions (Signed)
Call if the left breast tenderness continues and we will arrange for a diagnostic mammography and ultrasound.

## 2015-05-19 ENCOUNTER — Other Ambulatory Visit: Payer: Self-pay | Admitting: Family Medicine

## 2015-05-19 DIAGNOSIS — J309 Allergic rhinitis, unspecified: Secondary | ICD-10-CM | POA: Insufficient documentation

## 2015-05-19 MED ORDER — IPRATROPIUM BROMIDE 0.03 % NA SOLN: 2.0000 | Freq: Every day | NASAL | Status: AC

## 2015-05-19 NOTE — Telephone Encounter (Signed)
Pt contacted office for refill request on the following medications:  Ipratropium nasal spray .03%.  Mucarabones  CB#[phone]/MJ

## 2015-06-13 ENCOUNTER — Encounter: Payer: Self-pay | Admitting: Family Medicine

## 2015-06-13 ENCOUNTER — Ambulatory Visit (INDEPENDENT_AMBULATORY_CARE_PROVIDER_SITE_OTHER): Payer: No Typology Code available for payment source | Admitting: Family Medicine

## 2015-06-13 VITALS — BP 88/58 | HR 62 | Temp 98.0°F | Resp 16 | Wt 130.6 lb

## 2015-06-13 DIAGNOSIS — S39012A Strain of muscle, fascia and tendon of lower back, initial encounter: Secondary | ICD-10-CM | POA: Diagnosis not present

## 2015-06-13 MED ORDER — METHOCARBAMOL 500 MG PO TABS: 500.0000 mg | ORAL_TABLET | Freq: Four times a day (QID) | ORAL | Status: DC

## 2015-06-13 MED ORDER — MELOXICAM 15 MG PO TABS: 15.0000 mg | ORAL_TABLET | Freq: Every day | ORAL | Status: AC

## 2015-06-13 NOTE — Progress Notes (Signed)
Patient ID: Cynthia Edwards, female   DOB: 1956/03/06, 59 y.o.   MRN: [id number]       Patient: Cynthia Edwards Female    DOB: 03/18/1956   59 y.o.   MRN: [id number] Visit Date: 06/13/2015  Today's Provider: Vernie Murders, PA   Chief Complaint  Patient presents with  . Back Pain    left side, started on July 14th, 2016. was at the gym and pulled a muscle. Have been going to the TENS units. pain 3 out of a 10 today.   Subjective:    HPI  This 59 year old female felt a pulling discomfort at the gym during a workout. Spasms last night woke her from sleep. Has been seeing chiropractor for treatment the past couple weeks. Trying to pack to move to [place] permanently.  Tried to use some leftover Cyclobenzaprine but it caused nausea on 06-02-15 with Ibuprofen 600 mg q 6 hours. Ice pack some help.  Past Medical History  Diagnosis Date  . MVP (mitral valve prolapse)     pt states unable to find at last visit   History  Substance Use Topics  . Smoking status: Never Smoker   . Smokeless tobacco: Never Used  . Alcohol Use: No   Family History  Problem Relation Age of Onset  . Heart disease Mother   . Cancer Father     lung and kidney cancer   Past Surgical History  Procedure Laterality Date  . Tubal ligation    . Pelvic laparoscopy  08/30/1999    DX LAP W/ASPIRATION OF RT OV CYST/DR. LATHROP  . Foot surgery     No Known Allergies   Previous Medications   ESTRADIOL (VIVELLE-DOT) 0.05 MG/24HR PATCH    Place 1 patch onto the skin 2 (two) times a week.   IBUPROFEN (ADVIL,MOTRIN) 800 MG TABLET    Take 600 mg by mouth every 6 (six) hours as needed.    IPRATROPIUM (ATROVENT) 0.03 % NASAL SPRAY    Place 2 sprays into both nostrils daily.   MONTELUKAST (SINGULAIR) 10 MG TABLET       OXYBUTYNIN (DITROPAN) 5 MG TABLET       PROGESTERONE (PROMETRIUM) 100 MG CAPSULE    Take 1 capsule (100 mg total) by mouth daily.   PSEUDOEPHEDRINE HCL (SUDAFED PO)    Take by mouth.    Review of Systems   Constitutional: Negative.   HENT: Negative.   Respiratory: Negative.   Gastrointestinal: Negative.   Musculoskeletal: Positive for back pain.       Left lower back and worse with certain movements.  Neurological: Negative.    Objective:   BP 88/58 mmHg  Pulse 62  Temp(Src) 98 F (36.7 C) (Oral)  Resp 16  Wt 130 lb 9.6 oz (59.24 kg)  LMP 09/22/2012  Physical Exam  Constitutional: She appears well-developed and well-nourished.  HENT:  Head: Normocephalic and atraumatic.  Eyes: EOM are normal. Pupils are equal, round, and reactive to light.  Cardiovascular: Normal rate and regular rhythm.   Pulmonary/Chest: Effort normal and breath sounds normal.  Abdominal: Soft. Bowel sounds are normal.  Musculoskeletal: She exhibits tenderness.  Soreness in left lower lumbar musculature at waist line. SLR's 90 degrees without pain. No radiation of pain. No muscle weakness. Sharper pain to twist or flex spine.  Neurological: She is alert. She has normal reflexes.  Skin: No rash noted.  Psychiatric: She has a normal mood and affect. Her behavior is normal. Thought content normal.  Assessment & Plan:     1. Low back strain, initial encounter Onset over the past 10-12 days. Suspect muscular strain with spasms periodically. May continue ice or heat applications and continue TENS unit prn. Proceed with stretching/rehab exercises and treat with NSAID and Methocarbamol. Recheck in 2 weeks if no better. - meloxicam (MOBIC) 15 MG tablet; Take 1 tablet (15 mg total) by mouth daily.  Dispense: 30 tablet; Refill: 0 - methocarbamol (ROBAXIN) 500 MG tablet; Take 1 tablet (500 mg total) by mouth 4 (four) times daily.  Dispense: 30 tablet; Refill: 0       Vernie Murders, PA  [facility]

## 2015-06-27 ENCOUNTER — Other Ambulatory Visit: Payer: Self-pay | Admitting: Family Medicine

## 2015-06-27 DIAGNOSIS — S39012A Strain of muscle, fascia and tendon of lower back, initial encounter: Secondary | ICD-10-CM

## 2015-06-27 MED ORDER — METHOCARBAMOL 500 MG PO TABS: 500.0000 mg | ORAL_TABLET | Freq: Four times a day (QID) | ORAL | Status: DC

## 2015-06-27 MED ORDER — METHOCARBAMOL 500 MG PO TABS: 500.0000 mg | ORAL_TABLET | Freq: Four times a day (QID) | ORAL | Status: AC

## 2015-06-27 NOTE — Telephone Encounter (Signed)
RX sent to [facility]. Left patient a message advising her RX has been sent to pharmacy and to call for a appointment if symptoms persist or worsen.

## 2015-06-27 NOTE — Telephone Encounter (Signed)
Advise patient Robaxin was refilled and prescription sent to her pharmacy ([facility]). If back discomfort persistent or worsening, should recheck .

## 2015-06-27 NOTE — Telephone Encounter (Signed)
Pt contacted office for refill request on the following medications: methocarbamol (ROBAXIN) 500 MG tablet to [facility] on [address]. Thanks TNP

## 2015-10-17 ENCOUNTER — Encounter: Payer: Self-pay | Admitting: Gynecology

## 2015-10-21 ENCOUNTER — Telehealth: Payer: Self-pay

## 2015-10-21 NOTE — Telephone Encounter (Signed)
Patient said her Rx for Prog and Est expires on 10/22/15 but she can't fill it until 10/24/15 per ins co.  She said she has not taken more than she should she actually even had a period where she tried to go off so she has actually taken less. We talked about calling her in a 30 day supply that she would pay for OOP but i encouraged her to first call her ins co and let them know her dilemma and perhaps they could do some sort of override so that she can go ahead and get them now.  I asked her to call me back if I can be of assistance.

## 2022-09-06 ENCOUNTER — Other Ambulatory Visit: Payer: Self-pay | Admitting: *Deleted
